# Patient Record
Sex: Male | Born: 1970 | Race: Black or African American | Hispanic: No | Marital: Single | Smoking: Never smoker
Health system: Southern US, Community
[De-identification: ages and names within clinical notes are randomized; demographics above are authoritative.]

---

## 2012-01-20 ENCOUNTER — Emergency Department (HOSPITAL_COMMUNITY)
Admission: EM | Admit: 2012-01-20 | Discharge: 2012-01-20 | Disposition: A | Discharge status: Home / Self Care | Payer: Self-pay | Attending: Emergency Medicine | Admitting: Emergency Medicine

## 2012-01-20 ENCOUNTER — Emergency Department (HOSPITAL_COMMUNITY): Payer: Self-pay

## 2012-01-20 ENCOUNTER — Encounter (HOSPITAL_COMMUNITY): Payer: Self-pay | Admitting: Physical Medicine and Rehabilitation

## 2012-01-20 DIAGNOSIS — X500XXA Overexertion from strenuous movement or load, initial encounter: Secondary | ICD-10-CM | POA: Insufficient documentation

## 2012-01-20 DIAGNOSIS — IMO0002 Reserved for concepts with insufficient information to code with codable children: Secondary | ICD-10-CM | POA: Insufficient documentation

## 2012-01-20 DIAGNOSIS — R109 Unspecified abdominal pain: Secondary | ICD-10-CM | POA: Insufficient documentation

## 2012-01-20 DIAGNOSIS — S76219A Strain of adductor muscle, fascia and tendon of unspecified thigh, initial encounter: Secondary | ICD-10-CM

## 2012-01-20 LAB — URINALYSIS, ROUTINE W REFLEX MICROSCOPIC
Bilirubin Urine: NEGATIVE
Glucose, UA: NEGATIVE mg/dL
Ketones, ur: NEGATIVE mg/dL
Leukocytes, UA: NEGATIVE
Nitrite: NEGATIVE
Protein, ur: NEGATIVE mg/dL

## 2012-01-20 MED ORDER — IBUPROFEN 800 MG PO TABS: 800.0000 mg | ORAL_TABLET | Freq: Three times a day (TID) | ORAL | Status: AC

## 2012-01-20 MED ORDER — METHOCARBAMOL 500 MG PO TABS: 500.0000 mg | ORAL_TABLET | Freq: Two times a day (BID) | ORAL | Status: AC

## 2012-01-20 NOTE — ED Notes (Signed)
Pt presents to department for evaluation of R sided groin pain. States he was helping friend move today, lifting heavy boxes when he experienced sudden sharp pain to R groin region. 9/10 pain at the time. States area has been sore before, but pain felt different today. He is alert and oriented x4. Ambulatory to triage.

## 2012-01-20 NOTE — ED Provider Notes (Signed)
History   This chart was scribed for Troy Octave, MD by Charolett Bumpers . The patient was seen in room STRE6/STRE6.    CSN: [id number]  Arrival date & time 01/20/12  1029   First MD Initiated Contact with Patient 01/20/12 1100      Chief Complaint  Patient presents with  . Groin Pain    (Consider location/radiation/quality/duration/timing/severity/associated sxs/prior treatment) HPI Troy Walker is a 41 y.o. male who presents to the Emergency Department complaining of constant, moderate right-sided groin pain for the past year with the pain worsening today. Patient states that he was helping a friend move today, lifting heavy boxes when he experienced a sharp pain. Patient states groin pain is 9/10 and describes as sharp. Patient denies any associated symptoms.    No past medical history on file.  No past surgical history on file.  History reviewed. No pertinent family history.  History  Substance Use Topics  . Smoking status: Never Smoker   . Smokeless tobacco: Not on file  . Alcohol Use: No      Review of Systems  Gastrointestinal: Negative for vomiting.  Genitourinary: Negative for dysuria and hematuria.       Right-sided groin pain  All other systems reviewed and are negative.    Allergies  Review of patient's allergies indicates no known allergies.  Home Medications   Current Outpatient Rx  Name Route Sig Dispense Refill  . B COMPLEX PO TABS Oral Take 1 tablet by mouth daily.      BP 123/72  Pulse 75  Temp(Src) 97.7 F (36.5 C) (Oral)  Resp 18  SpO2 99%  Physical Exam  Nursing note and vitals reviewed. Constitutional: He is oriented to person, place, and time. He appears well-developed and well-nourished. No distress.  HENT:  Head: Normocephalic and atraumatic.  Eyes: EOM are normal.  Neck: Normal range of motion. Neck supple. No tracheal deviation present.  Cardiovascular: Normal rate.   Pulmonary/Chest: Effort normal. No  respiratory distress.  Abdominal: Soft. There is no tenderness.       No CVA tenderness.   Genitourinary:       No obvious inguinal hernia. No testicle tenderness bilaterally.  Musculoskeletal: Normal range of motion.       Full ROM of right hip.  Neurological: He is alert and oriented to person, place, and time.       Good great toe extension bilaterally. +2 DP and TP pulses bilaterally.   Skin: Skin is warm and dry.  Psychiatric: He has a normal mood and affect. His behavior is normal.    ED Course  Procedures (including critical care time)  DIAGNOSTIC STUDIES: Oxygen Saturation is 99% on room air, normal by my interpretation.    COORDINATION OF CARE:  1113: Discussed planned course of care with the patient who is agreeable at this time.  1152: Recheck: Discussed the results from the x-ray and urine. Discussed planned d/c.   Labs Reviewed  URINALYSIS, ROUTINE W REFLEX MICROSCOPIC   Ct Abdomen Pelvis Wo Contrast  01/20/2012  *RADIOLOGY REPORT*  Clinical Data: Right groin pain.  CT ABDOMEN AND PELVIS WITHOUT CONTRAST  Technique:  Multidetector CT imaging of the abdomen and pelvis was performed following the standard protocol without intravenous contrast.  Comparison: None.  Findings: Lung bases are clear.  No effusions.  Heart is normal size.  Liver, spleen, pancreas, adrenals are unremarkable.  Kidneys have an unremarkable appearance.  No renal or ureteral stones.  No hydronephrosis.  Urinary bladder  is unremarkable.  Appendix is visualized and is normal. Bowel grossly unremarkable. No free fluid, free air, or adenopathy.  Scattered bilateral inguinal lymph nodes, none pathologically enlarged.  No acute bony abnormality.  IMPRESSION: No acute findings in the abdomen or pelvis.  Original Report Authenticated By: Cyndie Chime, M.D.     No diagnosis found.    MDM  Chronic groin pain for the past year that worsened today with lifting a box. No vomiting, difficulty urination,  testicular pain. Abdomen soft nontender. Testicles nontender. No appreciable hernias.  Urinalysis negative. No findings on CT scan.  Anti-inflammatories, pain control, PCP followup.  I personally performed the services described in this documentation, which was scribed in my presence.  The recorded information has been reviewed and considered.      Troy Octave, MD 01/20/12 1155

## 2012-01-20 NOTE — Discharge Instructions (Signed)
Groin Strain (Adductor Strain)  A groin pull/strain refers to strained muscles on the upper inner part of the thigh. This usually occurs in muscles during exercise or participation in sports events. It is more likely to occur when your muscles are not warmed up well enough or if you are not properly conditioned. A pulled muscle, or muscle strain, occurs when a muscle is over stretched and some muscle fibers are torn. This is especially common in athletes where a sudden violent force placed on a muscle pushes it past its ability to respond. Usually, recovery from a pulled muscle takes 1 to 2 weeks, but complete healing will take 5 to 6 weeks.   HOME CARE INSTRUCTIONS    Apply ice to the sore muscle for 15 to 20 minutes every 2-3 hours for the first 2 days. Put ice in a plastic bag and place a towel between the bag of ice and your skin.   Do not strain the pulled muscle for as long as it is still painful.   Only take over-the-counter or prescription medicines for pain, discomfort, or fever as directed by your caregiver. Do not use aspirin as this may increase bleeding (bruising) at injury site.   Warming up before exercise and developing proper conditioning helps prevent muscle strains.  SEEK IMMEDIATE MEDICAL CARE IF:    There is increased pain or swelling in the affected area.   You are not improving or are getting worse.  Document Released: 04/20/2004 Document Revised: 08/12/2011 Document Reviewed: 04/15/2009  [employer] Patient Information 2012 [employer]

## 2012-01-20 NOTE — ED Notes (Signed)
Pt presents with 1 year h/o R groin pain.  Pt reports pain is intermittent, reports this morning he was picking up a large bag with onset of "severe" pain.  Pt denies that pain radiates, reports that "sometimes" he has pain with voiding.  Pt also reports intermittnet pain to R testicle, but denies any swelling to area.

## 2013-03-02 ENCOUNTER — Emergency Department (HOSPITAL_BASED_OUTPATIENT_CLINIC_OR_DEPARTMENT_OTHER): Payer: Medicaid - Out of State

## 2013-03-02 ENCOUNTER — Emergency Department (HOSPITAL_BASED_OUTPATIENT_CLINIC_OR_DEPARTMENT_OTHER)
Admission: EM | Admit: 2013-03-02 | Discharge: 2013-03-02 | Disposition: A | Discharge status: Home / Self Care | Payer: Medicaid - Out of State | Attending: Emergency Medicine | Admitting: Emergency Medicine

## 2013-03-02 ENCOUNTER — Encounter (HOSPITAL_BASED_OUTPATIENT_CLINIC_OR_DEPARTMENT_OTHER): Payer: Self-pay | Admitting: *Deleted

## 2013-03-02 DIAGNOSIS — R002 Palpitations: Secondary | ICD-10-CM

## 2013-03-02 DIAGNOSIS — R110 Nausea: Secondary | ICD-10-CM | POA: Insufficient documentation

## 2013-03-02 DIAGNOSIS — R079 Chest pain, unspecified: Secondary | ICD-10-CM

## 2013-03-02 DIAGNOSIS — R51 Headache: Secondary | ICD-10-CM | POA: Insufficient documentation

## 2013-03-02 DIAGNOSIS — J329 Chronic sinusitis, unspecified: Secondary | ICD-10-CM

## 2013-03-02 DIAGNOSIS — B349 Viral infection, unspecified: Secondary | ICD-10-CM

## 2013-03-02 DIAGNOSIS — J3489 Other specified disorders of nose and nasal sinuses: Secondary | ICD-10-CM | POA: Insufficient documentation

## 2013-03-02 DIAGNOSIS — B9789 Other viral agents as the cause of diseases classified elsewhere: Secondary | ICD-10-CM | POA: Insufficient documentation

## 2013-03-02 DIAGNOSIS — IMO0001 Reserved for inherently not codable concepts without codable children: Secondary | ICD-10-CM | POA: Insufficient documentation

## 2013-03-02 DIAGNOSIS — R5381 Other malaise: Secondary | ICD-10-CM | POA: Insufficient documentation

## 2013-03-02 LAB — COMPREHENSIVE METABOLIC PANEL
AST: 26 U/L (ref 0–37)
Albumin: 3.5 g/dL (ref 3.5–5.2)
BUN: 15 mg/dL (ref 6–23)
Chloride: 103 mEq/L (ref 96–112)
Creatinine, Ser: 1.5 mg/dL — ABNORMAL HIGH (ref 0.50–1.35)
Total Bilirubin: 0.2 mg/dL — ABNORMAL LOW (ref 0.3–1.2)
Total Protein: 7.6 g/dL (ref 6.0–8.3)

## 2013-03-02 LAB — CBC
HCT: 41 % (ref 39.0–52.0)
MCHC: 33.9 g/dL (ref 30.0–36.0)
MCV: 77.4 fL — ABNORMAL LOW (ref 78.0–100.0)
Platelets: 206 10*3/uL (ref 150–400)
RDW: 15 % (ref 11.5–15.5)
WBC: 5.3 10*3/uL (ref 4.0–10.5)

## 2013-03-02 LAB — TROPONIN I: Troponin I: <0.3 ng/mL (ref <0.30)

## 2013-03-02 MED ORDER — AZITHROMYCIN 250 MG PO TABS: 250.0000 mg | ORAL_TABLET | Freq: Every day | ORAL | Status: DC

## 2013-03-02 MED ORDER — SODIUM CHLORIDE 0.9 % IV BOLUS (SEPSIS): 1000.0000 mL | Freq: Once | INTRAVENOUS | Status: DC

## 2013-03-02 MED ORDER — TRAMADOL HCL 50 MG PO TABS: 50.0000 mg | ORAL_TABLET | Freq: Four times a day (QID) | ORAL | Status: DC | PRN

## 2013-03-02 MED ORDER — HYDROMORPHONE HCL PF 1 MG/ML IJ SOLN
1.0000 mg | Freq: Once | INTRAMUSCULAR | Status: AC
  Administered 2013-03-02: 1 mg via INTRAVENOUS
  Filled 2013-03-02: qty 1

## 2013-03-02 MED ORDER — SODIUM CHLORIDE 0.9 % IV SOLN: INTRAVENOUS | Status: DC

## 2013-03-02 MED ORDER — ONDANSETRON HCL 4 MG/2ML IJ SOLN
4.0000 mg | Freq: Once | INTRAMUSCULAR | Status: AC
  Administered 2013-03-02: 4 mg via INTRAVENOUS
  Filled 2013-03-02: qty 2

## 2013-03-02 NOTE — ED Notes (Addendum)
Pt reports palpitations and cp and SOB  x 3 days ago pt also c/o h/a x 4 days

## 2013-03-02 NOTE — ED Provider Notes (Signed)
History    CSN: [id number] Arrival date & time 03/02/13  1338  First MD Initiated Contact with Patient 03/02/13 1348     Chief Complaint  Patient presents with  . Palpitations   (Consider location/radiation/quality/duration/timing/severity/associated sxs/prior Treatment) Patient is a 42 y.o. male presenting with palpitations. The history is provided by the patient and the spouse.  Palpitations Associated symptoms: chest pain, nausea and shortness of breath   Associated symptoms: no cough and no vomiting    patient with a 2 day history of constant substernal chest pain. Has not resolved. Will wax and wane in severity. Currently 3/10. Associated with bodyaches some nausea fatigue mild headaches and shortness of breath some congestion some sinus pressure feeling. No history of heart arrhythmias no history of any coronary artery disease. No history of similar symptoms. Chest pain described as an ache. History reviewed. No pertinent past medical history. History reviewed. No pertinent past surgical history. History reviewed. No pertinent family history. History  Substance Use Topics  . Smoking status: Never Smoker   . Smokeless tobacco: Not on file  . Alcohol Use: No    Review of Systems  Constitutional: Positive for fatigue.  HENT: Positive for congestion and sinus pressure. Negative for neck pain.   Eyes: Negative for redness and visual disturbance.  Respiratory: Positive for shortness of breath. Negative for cough.   Cardiovascular: Positive for chest pain and palpitations. Negative for leg swelling.  Gastrointestinal: Positive for nausea. Negative for vomiting, abdominal pain and diarrhea.  Genitourinary: Negative for dysuria.  Musculoskeletal: Positive for myalgias.  Skin: Negative for rash.  Neurological: Positive for headaches.  Hematological: Does not bruise/bleed easily.  Psychiatric/Behavioral: Negative for confusion.    Allergies  Review of patient's allergies  indicates no known allergies.  Home Medications   Current Outpatient Rx  Name  Route  Sig  Dispense  Refill  . azithromycin (ZITHROMAX) 250 MG tablet   Oral   Take 1 tablet (250 mg total) by mouth daily. Take first 2 tablets together, then 1 every day until finished.   6 tablet   0   . traMADol (ULTRAM) 50 MG tablet   Oral   Take 1 tablet (50 mg total) by mouth every 6 (six) hours as needed for pain.   15 tablet   0    BP 130/69  Pulse 68  Temp(Src) 98.2 F (36.8 C) (Oral)  Resp 16  Ht 6\' 2"  (1.88 m)  Wt 215 lb (97.523 kg)  BMI 27.59 kg/m2  SpO2 100% Physical Exam  Nursing note and vitals reviewed. Constitutional: He is oriented to person, place, and time. He appears well-developed and well-nourished. No distress.  HENT:  Head: Normocephalic and atraumatic.  Mouth/Throat: Oropharynx is clear and moist.  Eyes: Conjunctivae are normal. Pupils are equal, round, and reactive to light.  Neck: Normal range of motion. Neck supple.  Cardiovascular: Normal rate, regular rhythm, normal heart sounds and intact distal pulses.   No murmur heard. Pulmonary/Chest: Effort normal and breath sounds normal. No respiratory distress.  Abdominal: Soft. Bowel sounds are normal. There is no tenderness.  Musculoskeletal: Normal range of motion. He exhibits no edema.  Neurological: He is alert and oriented to person, place, and time. No cranial nerve deficit. He exhibits normal muscle tone. Coordination normal.  Skin: Skin is warm. No erythema.    ED Course  Procedures (including critical care time) Labs Reviewed  CBC - Abnormal; Notable for the following:    MCV 77.4 (*)  All other components within normal limits  COMPREHENSIVE METABOLIC PANEL - Abnormal; Notable for the following:    Glucose, Bld 114 (*)    Creatinine, Ser 1.50 (*)    Total Bilirubin 0.2 (*)    GFR calc non Af Amer 57 (*)    GFR calc Af Amer 66 (*)    All other components within normal limits  TROPONIN I   Dg  Chest 2 View  03/02/2013   *RADIOLOGY REPORT*  Clinical Data: C/o heart palpitations, mid chest pain and SOB  CHEST - 2 VIEW  Comparison: None.  Findings: Cardiac and mediastinal contours appear normal.  The lungs appear clear.  No pleural effusion is identified.  IMPRESSION:  No significant abnormality identified.   Original Report Authenticated By: Gaylyn Rong, M.D.   Ct Head Wo Contrast  03/02/2013   *RADIOLOGY REPORT*  Clinical Data: Headaches  CT HEAD WITHOUT CONTRAST  Technique:  Contiguous axial images were obtained from the base of the skull through the vertex without contrast.  Comparison: None.  Findings: Bony calvarium appears intact.  Mild left maxillary sinusitis is noted. No mass effect or midline shift is noted. Ventricular size is within normal limits.  There is no evidence of hemorrhage or acute infarction.  Calcified meningioma is noted in the anterior portion of the cerebral falx.  IMPRESSION: Mild left maxillary sinusitis.  Small calcified meningioma seen anteriorly.  No acute intracranial abnormality seen.   Original Report Authenticated By: Lupita Raider.,  M.D.    Date: 03/02/2013  Rate: 73  Rhythm: normal sinus rhythm  QRS Axis: normal  Intervals: normal  ST/T Wave abnormalities: normal  Conduction Disutrbances:none  Narrative Interpretation:   Old EKG Reviewed: none available  Results for orders placed during the hospital encounter of 03/02/13  TROPONIN I      Result Value Range   Troponin I <0.30  <0.30 ng/mL  CBC      Result Value Range   WBC 5.3  4.0 - 10.5 K/uL   RBC 5.30  4.22 - 5.81 MIL/uL   Hemoglobin 13.9  13.0 - 17.0 g/dL   HCT 84.6  96.2 - 95.2 %   MCV 77.4 (*) 78.0 - 100.0 fL   MCH 26.2  26.0 - 34.0 pg   MCHC 33.9  30.0 - 36.0 g/dL   RDW 84.1  32.4 - 40.1 %   Platelets 206  150 - 400 K/uL  COMPREHENSIVE METABOLIC PANEL      Result Value Range   Sodium 139  135 - 145 mEq/L   Potassium 4.0  3.5 - 5.1 mEq/L   Chloride 103  96 - 112 mEq/L    CO2 27  19 - 32 mEq/L   Glucose, Bld 114 (*) 70 - 99 mg/dL   BUN 15  6 - 23 mg/dL   Creatinine, Ser 0.27 (*) 0.50 - 1.35 mg/dL   Calcium 9.6  8.4 - 25.3 mg/dL   Total Protein 7.6  6.0 - 8.3 g/dL   Albumin 3.5  3.5 - 5.2 g/dL   AST 26  0 - 37 U/L   ALT 23  0 - 53 U/L   Alkaline Phosphatase 58  39 - 117 U/L   Total Bilirubin 0.2 (*) 0.3 - 1.2 mg/dL   GFR calc non Af Amer 57 (*) >90 mL/min   GFR calc Af Amer 66 (*) >90 mL/min     1. Viral illness   2. Sinusitis   3. Heart palpitations   4.  Chest pain     MDM  Workup in the emergency part without evidence of arrhythmias. Cardiac monitor in sinus rhythm. Troponin without acute changes EKG without acute changes. Chest x-rays negative for pneumonia pneumothorax or pulmonary edema. Only positive finding was some left-sided maxillary sinusitis which we'll treat with an antibiotic. No significant leukocytosis no significant liver function or electrolyte abnormalities. Renal function creatinine slightly elevated at 1.5. The potassium is normal. Suspect a viral illness with some left-sided maxillary sinusitis. Patient will return for any newer worse palpitations or chest pain or shortness of breath followup with her regular Dr.  Shelda Jakes, MD 03/02/13 1520

## 2013-04-30 ENCOUNTER — Emergency Department (HOSPITAL_BASED_OUTPATIENT_CLINIC_OR_DEPARTMENT_OTHER)
Admission: EM | Admit: 2013-04-30 | Discharge: 2013-04-30 | Disposition: A | Discharge status: Home / Self Care | Payer: Self-pay | Attending: Emergency Medicine | Admitting: Emergency Medicine

## 2013-04-30 ENCOUNTER — Encounter (HOSPITAL_BASED_OUTPATIENT_CLINIC_OR_DEPARTMENT_OTHER): Payer: Self-pay

## 2013-04-30 DIAGNOSIS — Z202 Contact with and (suspected) exposure to infections with a predominantly sexual mode of transmission: Secondary | ICD-10-CM | POA: Insufficient documentation

## 2013-04-30 DIAGNOSIS — R1030 Lower abdominal pain, unspecified: Secondary | ICD-10-CM

## 2013-04-30 DIAGNOSIS — Z792 Long term (current) use of antibiotics: Secondary | ICD-10-CM | POA: Insufficient documentation

## 2013-04-30 DIAGNOSIS — R109 Unspecified abdominal pain: Secondary | ICD-10-CM | POA: Insufficient documentation

## 2013-04-30 DIAGNOSIS — R369 Urethral discharge, unspecified: Secondary | ICD-10-CM | POA: Insufficient documentation

## 2013-04-30 LAB — URINALYSIS, ROUTINE W REFLEX MICROSCOPIC
Bilirubin Urine: NEGATIVE
Leukocytes, UA: NEGATIVE
Nitrite: NEGATIVE
Specific Gravity, Urine: 1.027 (ref 1.005–1.030)
Urobilinogen, UA: 1 mg/dL (ref 0.0–1.0)
pH: 6 (ref 5.0–8.0)

## 2013-04-30 MED ORDER — DOXYCYCLINE HYCLATE 100 MG PO CAPS: 100.0000 mg | ORAL_CAPSULE | Freq: Two times a day (BID) | ORAL | Status: AC

## 2013-04-30 MED ORDER — CEFTRIAXONE SODIUM 250 MG IJ SOLR
250.0000 mg | Freq: Once | INTRAMUSCULAR | Status: AC
  Administered 2013-04-30: 250 mg via INTRAMUSCULAR
  Filled 2013-04-30: qty 250

## 2013-04-30 MED ORDER — AZITHROMYCIN 250 MG PO TABS
1000.0000 mg | ORAL_TABLET | Freq: Once | ORAL | Status: AC
  Administered 2013-04-30: 1000 mg via ORAL
  Filled 2013-04-30: qty 4

## 2013-04-30 NOTE — ED Provider Notes (Signed)
  CSN: [id number]     Arrival date & time 04/30/13  0909 History     First MD Initiated Contact with Patient 04/30/13 [phone]     Chief Complaint  Patient presents with  . Penile Discharge  . Exposure to STD   (Consider location/radiation/quality/duration/timing/severity/associated sxs/prior Treatment) HPI 42 y.o. Male with complaints of suprapubic pain began 1-2 days ago and penile pain. States engaged in oral sex with stranger during vacation last week and concerned for std.  Pain is dull and suprapubic.  Nonradiating. Denies dysuria or urethral discharge.  History reviewed. No pertinent past medical history. History reviewed. No pertinent past surgical history. No family history on file. History  Substance Use Topics  . Smoking status: Never Smoker   . Smokeless tobacco: Not on file  . Alcohol Use: Yes     Comment: occasional    Review of Systems  All other systems reviewed and are negative.    Allergies  Review of patient's allergies indicates no known allergies.  Home Medications   Current Outpatient Rx  Name  Route  Sig  Dispense  Refill  . doxycycline (VIBRAMYCIN) 100 MG capsule   Oral   Take 1 capsule (100 mg total) by mouth 2 (two) times daily.   20 capsule   0    BP 137/81  Pulse 75  Temp(Src) 97.9 F (36.6 C) (Oral)  Resp 20  Ht 6\' 2"  (1.88 m)  Wt 215 lb (97.523 kg)  BMI 27.59 kg/m2  SpO2 100% Physical Exam  Nursing note and vitals reviewed. Constitutional: He is oriented to person, place, and time. He appears well-developed and well-nourished.  HENT:  Head: Normocephalic and atraumatic.  Right Ear: External ear normal.  Left Ear: External ear normal.  Nose: Nose normal.  Mouth/Throat: Oropharynx is clear and moist.  Eyes: Conjunctivae and EOM are normal. Pupils are equal, round, and reactive to light.  Neck: Normal range of motion. Neck supple.  Cardiovascular: Normal rate, regular rhythm and normal heart sounds.   Pulmonary/Chest: Effort normal  and breath sounds normal.  Abdominal: Soft. Bowel sounds are normal.  Musculoskeletal: Normal range of motion.  Neurological: He is alert and oriented to person, place, and time. He has normal reflexes.  Skin: Skin is warm and dry.  Psychiatric: He has a normal mood and affect. His behavior is normal. Judgment and thought content normal.    ED Course   Procedures (including critical care time)  Labs Reviewed  GC/CHLAMYDIA PROBE AMP  URINALYSIS, ROUTINE W REFLEX MICROSCOPIC   No results found. 1. Suprapubic pain, unspecified laterality   2. Possible exposure to STD       Hilario Quarry, MD 04/30/13 1240

## 2013-04-30 NOTE — ED Notes (Signed)
Pt reports low abdominal pain, penile discomfort and possible discharge.  Possibly STD exposure.

## 2013-05-01 LAB — GC/CHLAMYDIA PROBE AMP
CT Probe RNA: NEGATIVE
GC Probe RNA: NEGATIVE

## 2013-05-02 ENCOUNTER — Encounter (HOSPITAL_COMMUNITY): Payer: Self-pay | Admitting: Physical Medicine and Rehabilitation

## 2013-10-27 IMAGING — CT CT ABD-PELV W/O CM
2 of 4 series · 16 of 46 positions shown, 18 images · non-contrast
Comparison: None.

CLINICAL DATA: Right groin pain.

CT ABDOMEN AND PELVIS WITHOUT CONTRAST
TECHNIQUE: Multidetector CT imaging of the abdomen and pelvis was
performed following the standard protocol without intravenous
contrast.

[Series 2: abd/pelv w/o 5.0 b31f st · axial · non-contrast · 0.75mm/px · z∈[-556,-122]mm · 13 of 95 slices shown, 15 images]
[im 4/95  soft-tissue]
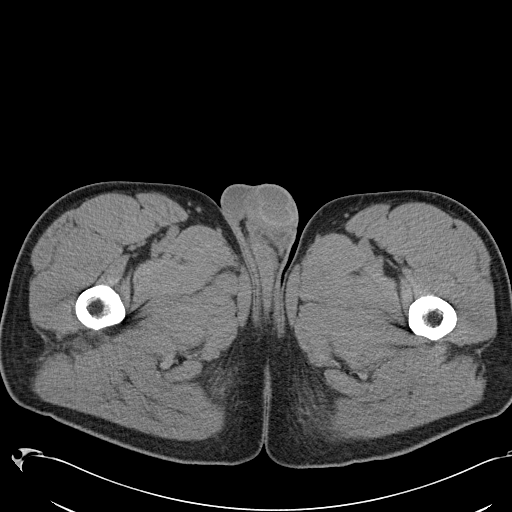
[im 4/95  bone]
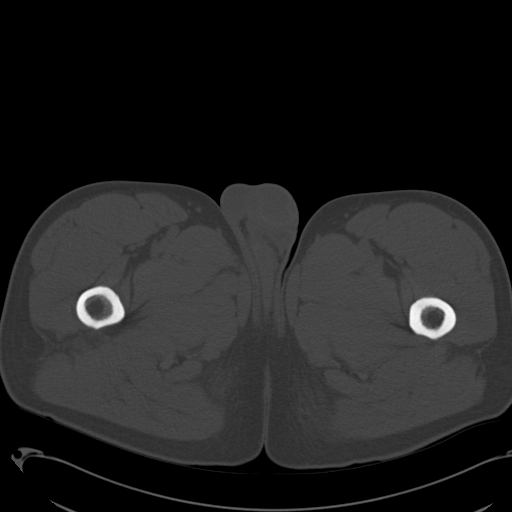
[im 12/95  soft-tissue]
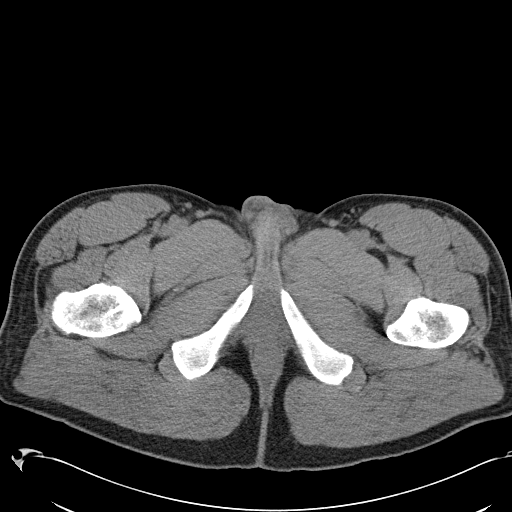
[im 19/95  soft-tissue]
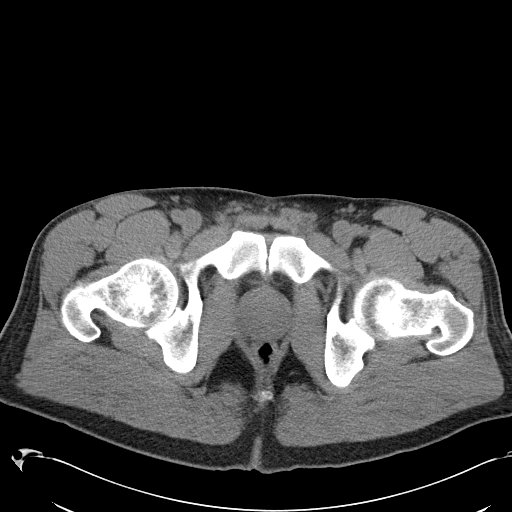
[im 27/95  soft-tissue]
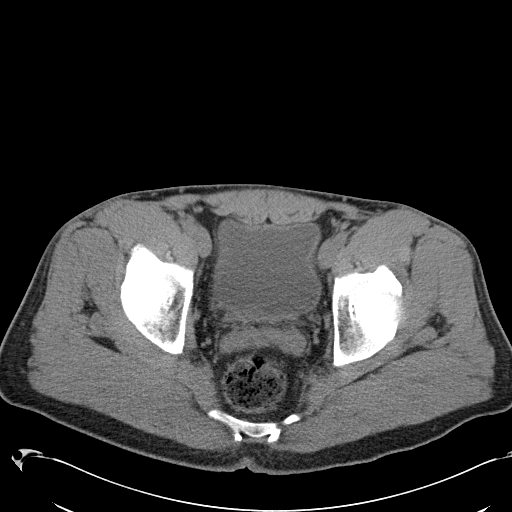
[im 34/95  soft-tissue]
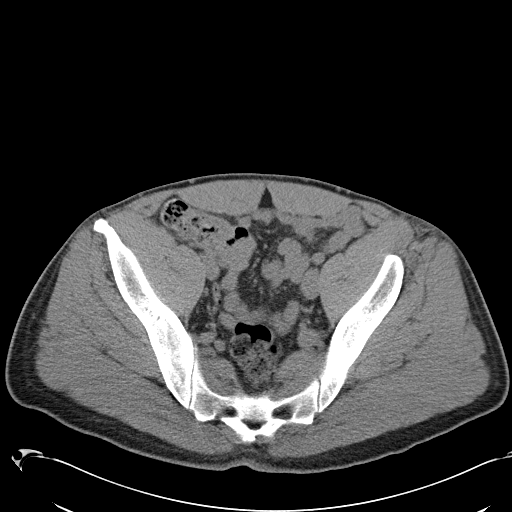
[im 42/95  soft-tissue]
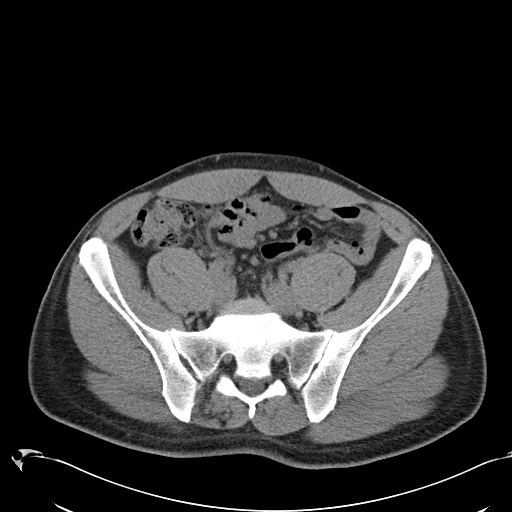
[im 49/95  soft-tissue]
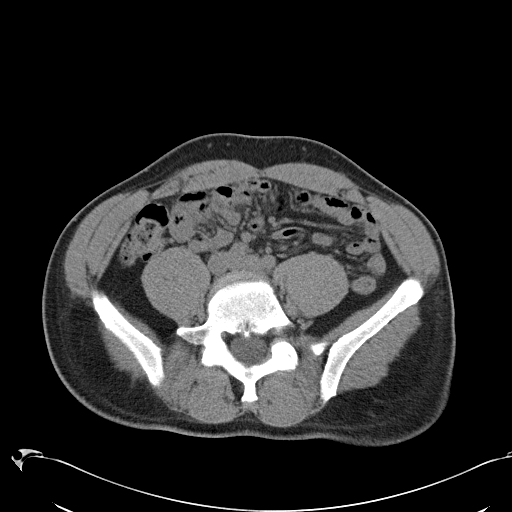
[im 53/95  soft-tissue]
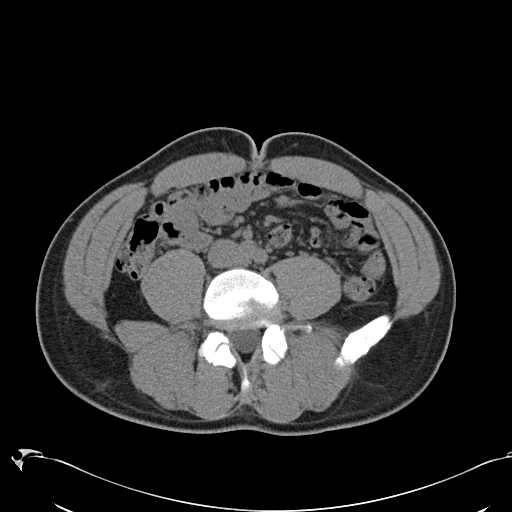
[im 61/95  soft-tissue]
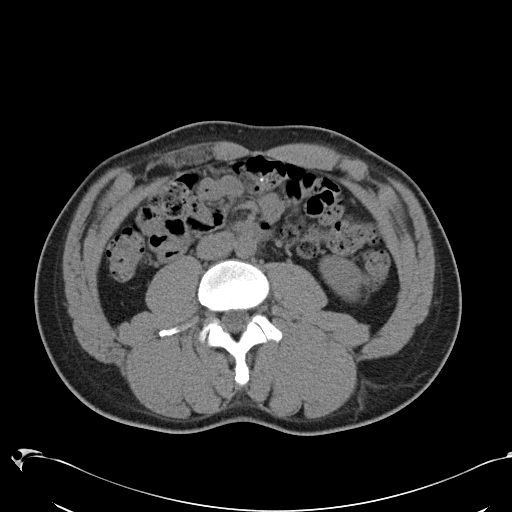
[im 61/95  bone]
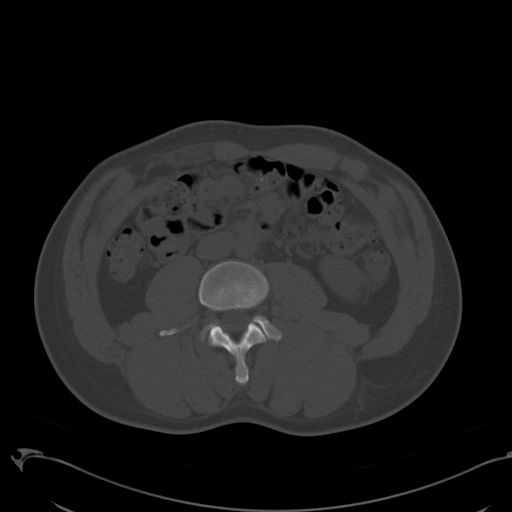
[im 68/95  soft-tissue]
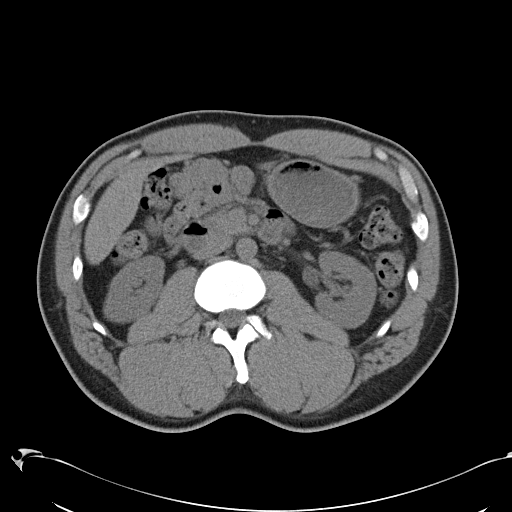
[im 76/95  soft-tissue]
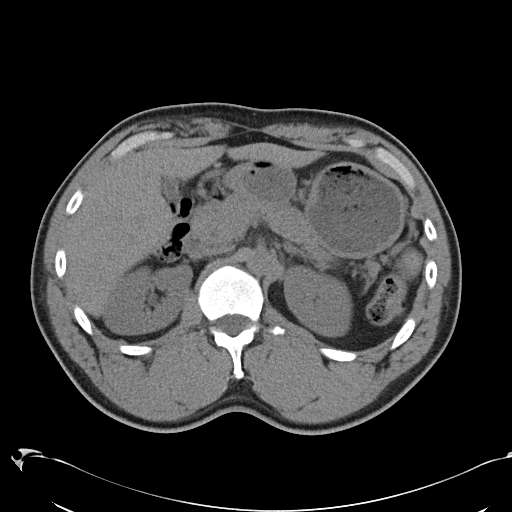
[im 83/95  soft-tissue]
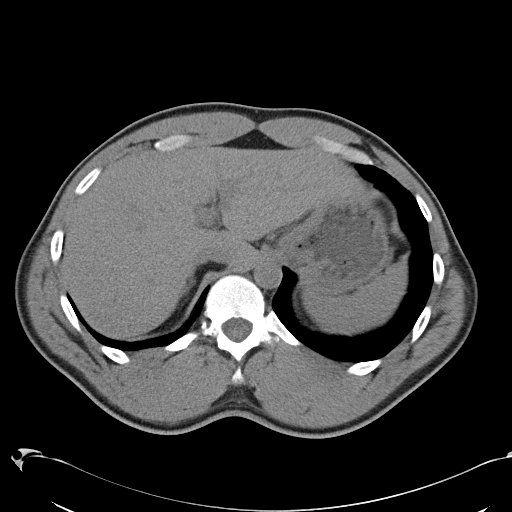
[im 91/95  soft-tissue]
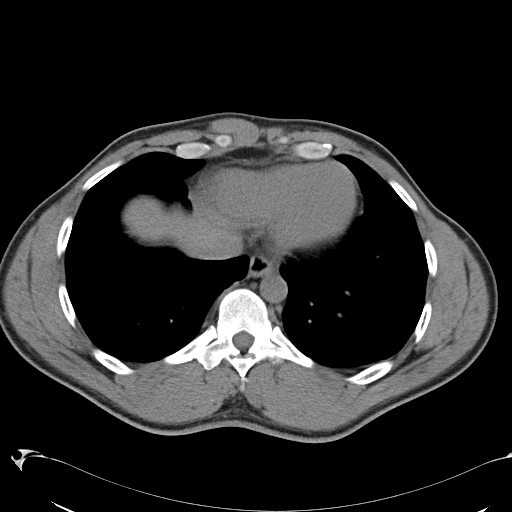

[Series 5: coronals · coronal · 0.64mm/px · 3 of 125 slices shown]
[im 42/125  soft-tissue]
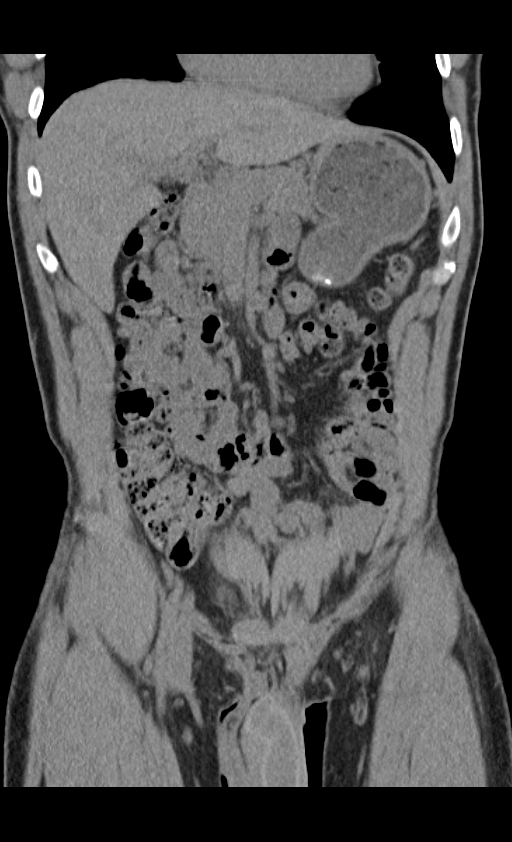
[im 56/125  soft-tissue]
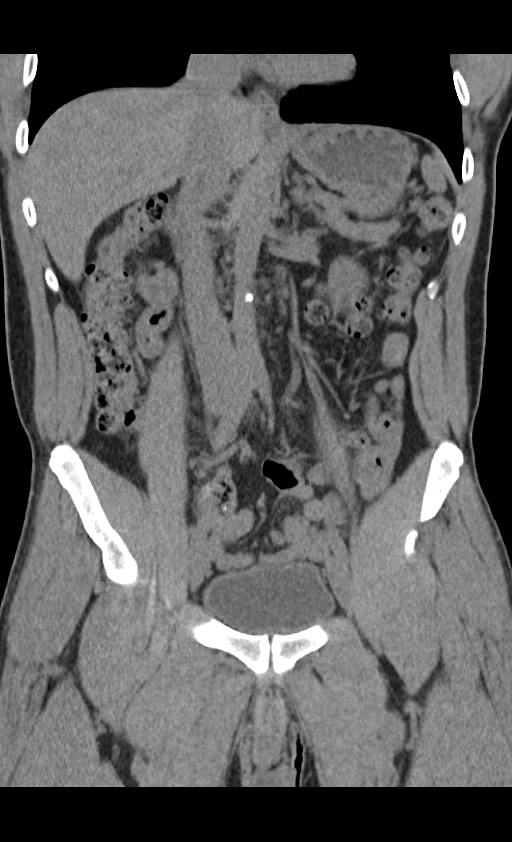
[im 69/125  soft-tissue]
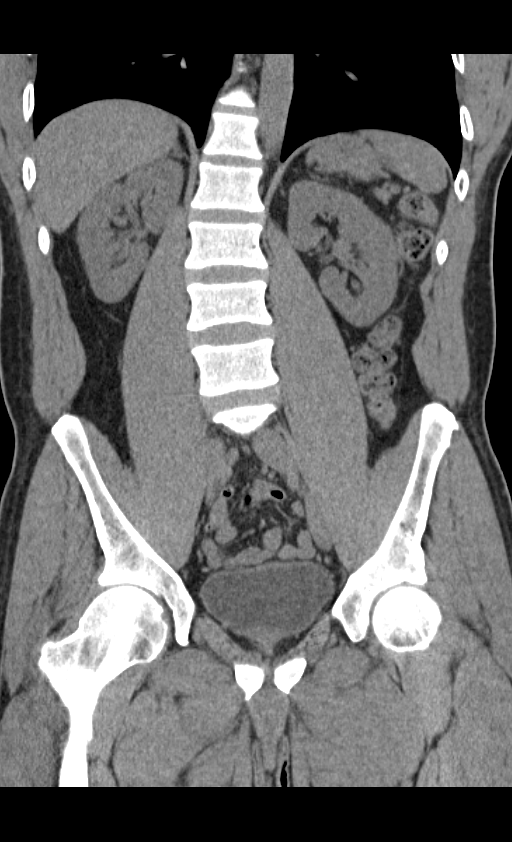

[16 of 46 positions shown; findings below may reference images not displayed]

FINDINGS: Lung bases are clear.  No effusions.  Heart is normal
size.

Liver, spleen, pancreas, adrenals are unremarkable.  Kidneys have
an unremarkable appearance.  No renal or ureteral stones.  No
hydronephrosis.  Urinary bladder is unremarkable.

Appendix is visualized and is normal. Bowel grossly unremarkable.
No free fluid, free air, or adenopathy.  Scattered bilateral
inguinal lymph nodes, none pathologically enlarged.

No acute bony abnormality.
IMPRESSION: No acute findings in the abdomen or pelvis.
